# Patient Record
Sex: Male | Born: 1970 | Race: White | Hispanic: No | Marital: Single | Smoking: Current every day smoker
Health system: Southern US, Community
[De-identification: ages and names within clinical notes are randomized; demographics above are authoritative.]

---

## 2005-11-15 ENCOUNTER — Emergency Department: Payer: Self-pay | Admitting: Internal Medicine

## 2005-11-25 ENCOUNTER — Emergency Department: Payer: Self-pay | Admitting: Emergency Medicine

## 2007-07-15 IMAGING — CR RIGHT RING FINGER 2+V
1 series · 3 of 3 positions shown · non-contrast
Comparison: none

REASON FOR EXAM: injury MC2
COMMENTS:  LMP: (Male)

PROCEDURE:     DXR - DXR FINGER RING 4TH DIGIT RT ELAD  - November 15, 2005  [DATE]
RESULT:     Three views of the RIGHT fourth finger show no fracture,
dislocation or other acute bony abnormality. A bandage is present about the
PIP joint. No radiodense soft tissue foreign body is seen.

[Series 1: view not recorded · 0.17mm/px · 3 of 3 slices shown]
[im 1/3]
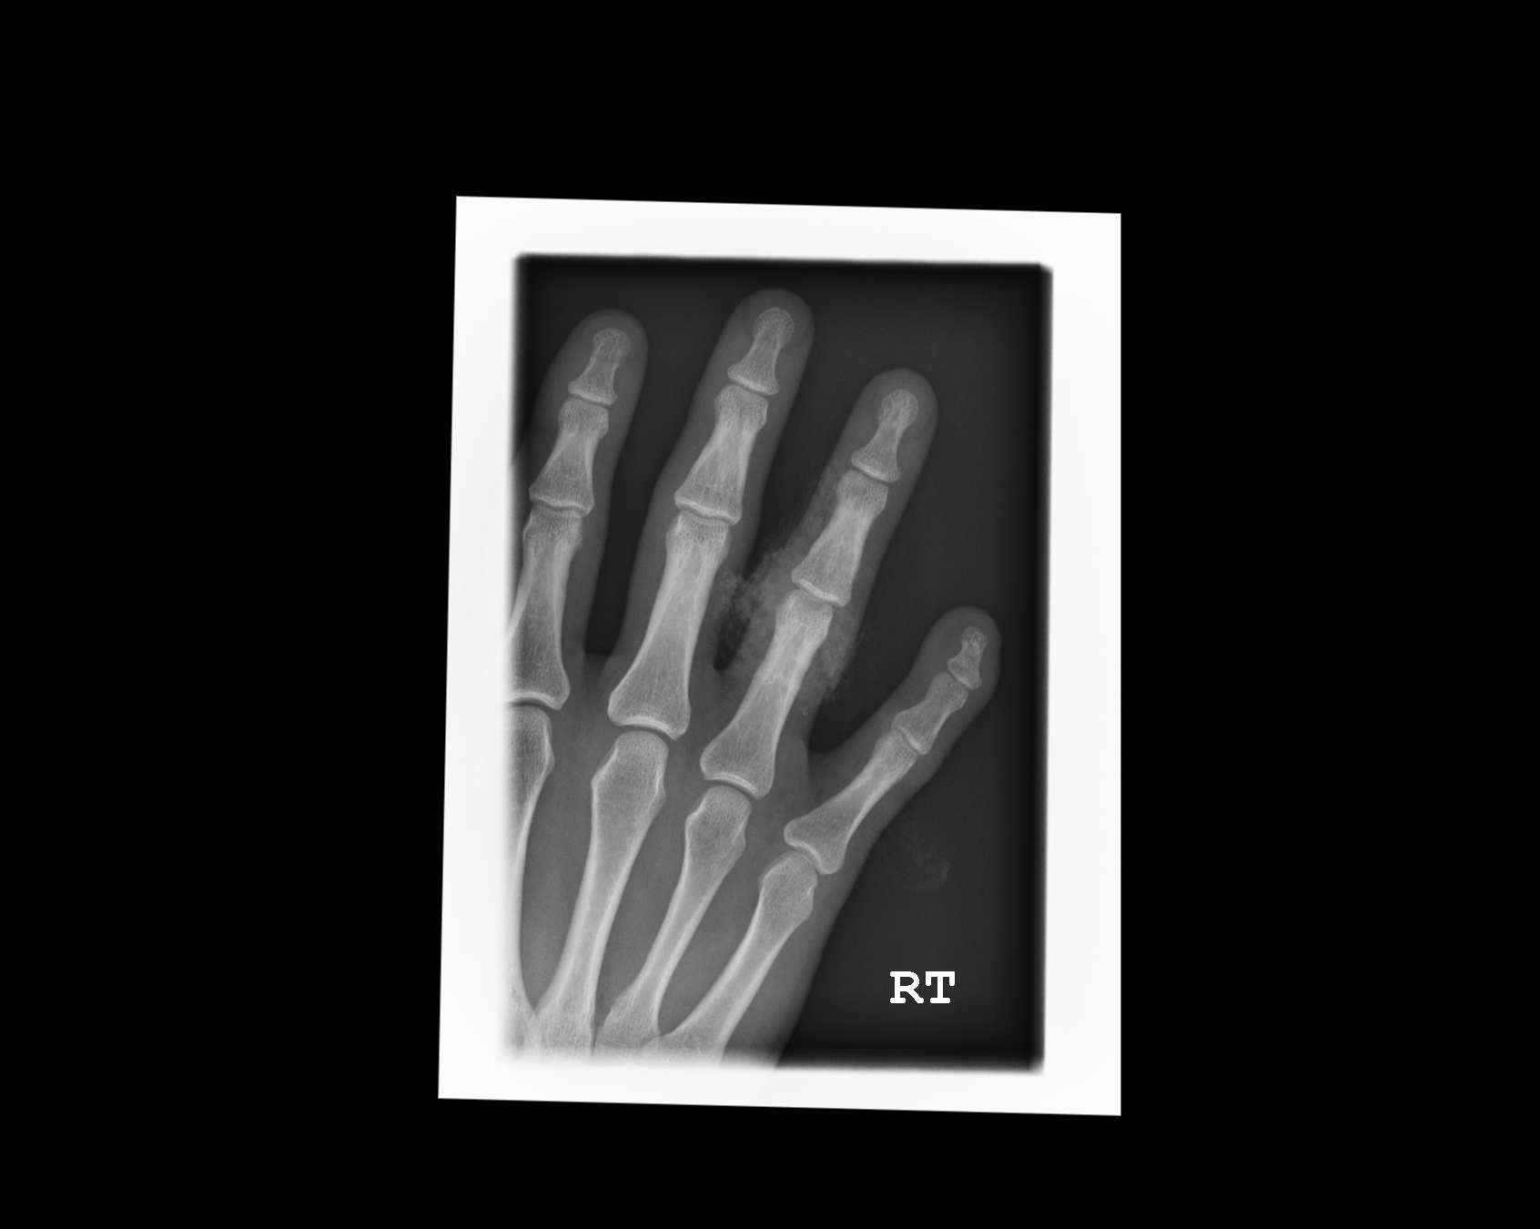
[im 2/3]
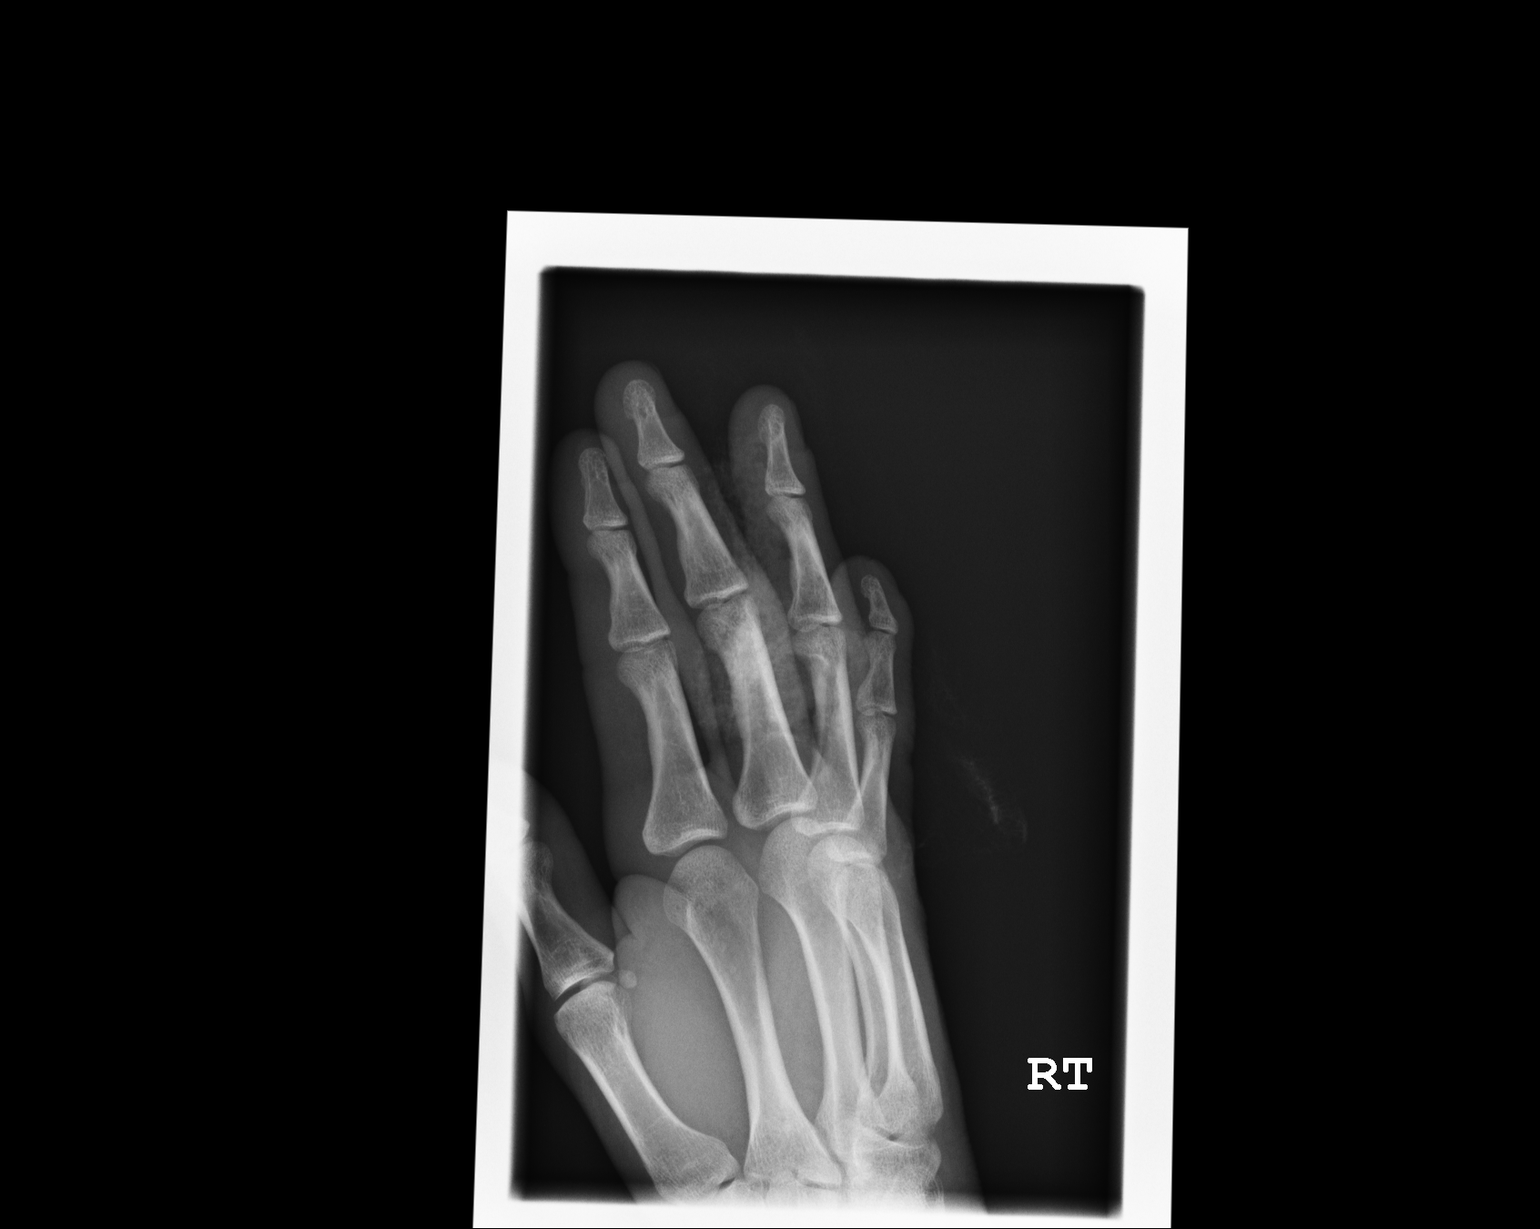
[im 3/3]
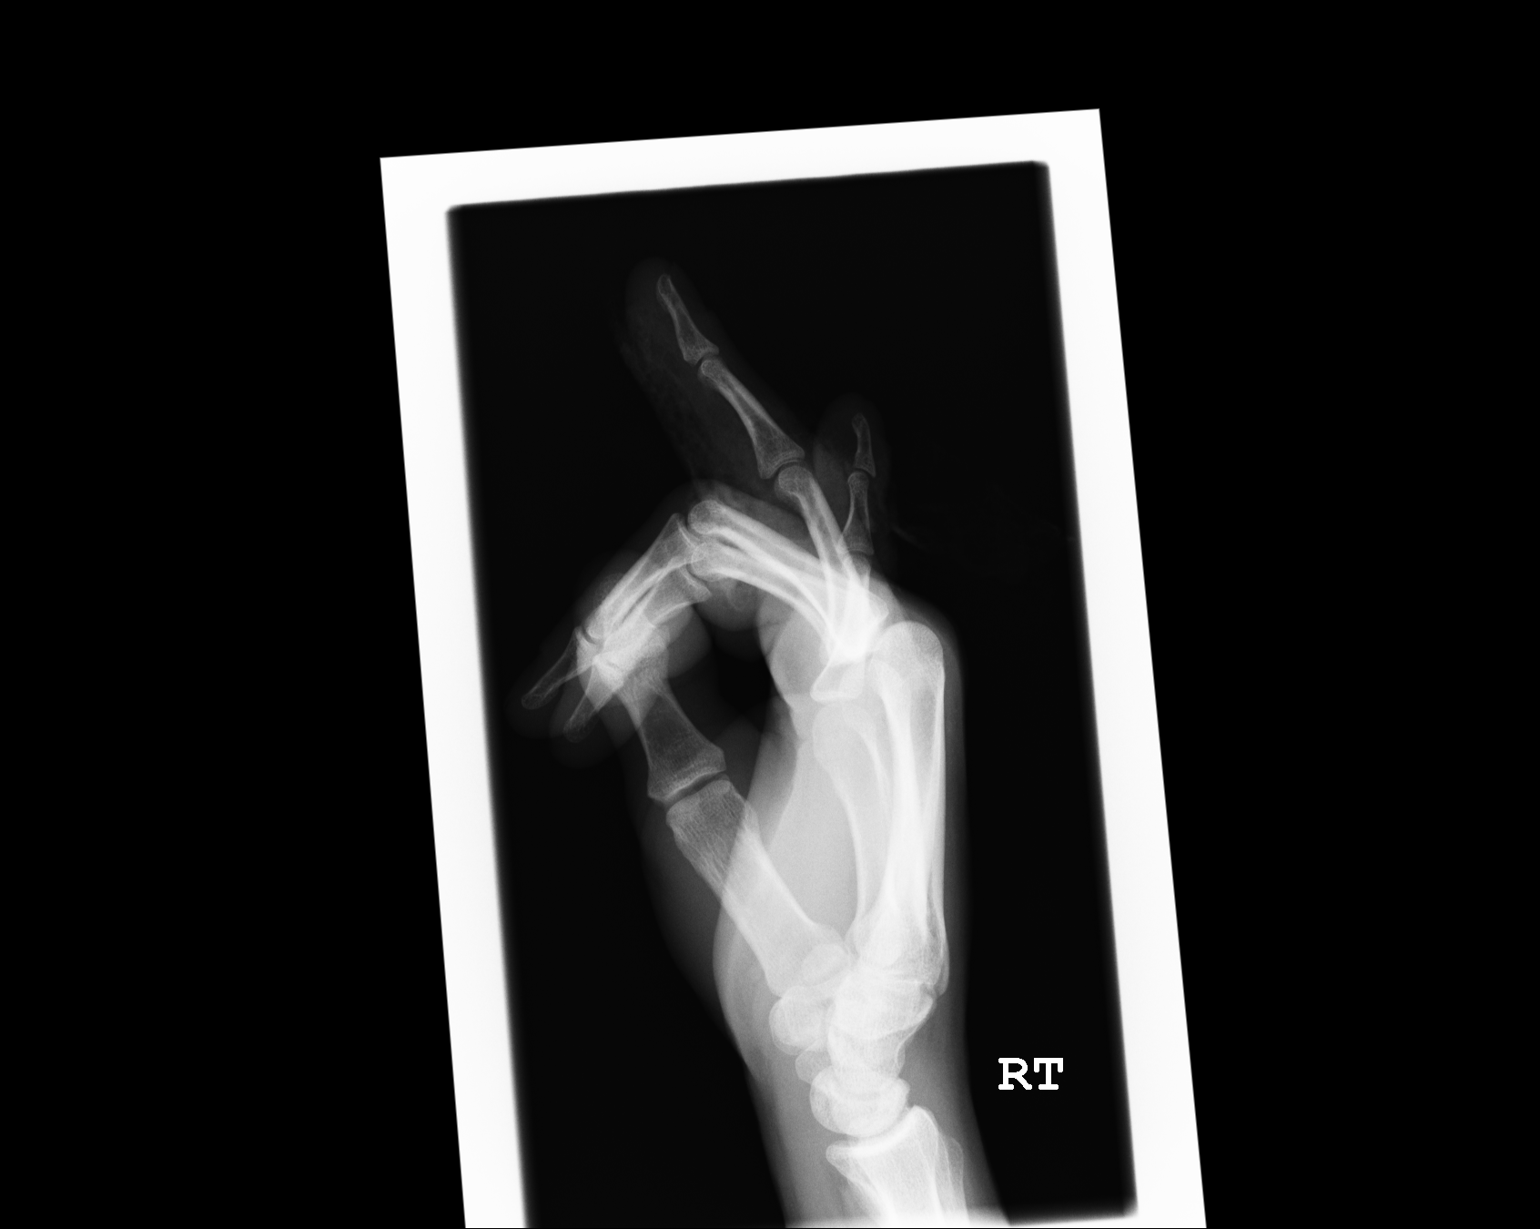

[3 of 3 positions shown; findings below may reference images not displayed]

IMPRESSION: 1)No acute bony abnormalities are identified.

## 2016-08-20 ENCOUNTER — Emergency Department
Admission: EM | Admit: 2016-08-20 | Discharge: 2016-08-20 | Disposition: A | Discharge status: Home / Self Care | Payer: Self-pay | Attending: Emergency Medicine | Admitting: Emergency Medicine

## 2016-08-20 ENCOUNTER — Encounter: Payer: Self-pay | Admitting: Emergency Medicine

## 2016-08-20 ENCOUNTER — Emergency Department: Payer: Self-pay

## 2016-08-20 DIAGNOSIS — Y999 Unspecified external cause status: Secondary | ICD-10-CM | POA: Insufficient documentation

## 2016-08-20 DIAGNOSIS — S52521A Torus fracture of lower end of right radius, initial encounter for closed fracture: Secondary | ICD-10-CM | POA: Insufficient documentation

## 2016-08-20 DIAGNOSIS — W19XXXA Unspecified fall, initial encounter: Secondary | ICD-10-CM | POA: Insufficient documentation

## 2016-08-20 DIAGNOSIS — Y9389 Activity, other specified: Secondary | ICD-10-CM | POA: Insufficient documentation

## 2016-08-20 DIAGNOSIS — Y929 Unspecified place or not applicable: Secondary | ICD-10-CM | POA: Insufficient documentation

## 2016-08-20 DIAGNOSIS — F17210 Nicotine dependence, cigarettes, uncomplicated: Secondary | ICD-10-CM | POA: Insufficient documentation

## 2016-08-20 NOTE — ED Triage Notes (Signed)
Fell yesterday while playing with grandchildren.  C/O right wrist pain.

## 2016-08-20 NOTE — ED Notes (Signed)
Full rom fingers, +2 pulse.

## 2016-08-20 NOTE — Discharge Instructions (Signed)
Wrist splint for 2-3 weeks as needed.

## 2016-08-20 NOTE — ED Provider Notes (Signed)
[facility] Emergency Department Provider Note   ____________________________________________   First MD Initiated Contact with Patient 08/20/16 1429     (approximate)  I have reviewed the triage vital signs and the nursing notes.   HISTORY  Chief Complaint Wrist Injury    HPI Juan Walters is a 46 y.o. male patient complaining of right wrist pain secondary to a fall while playing with gr children yesterday.   History reviewed. No pertinent past medical history.  There are no active problems to display for this patient.   History reviewed. No pertinent surgical history.  Prior to Admission medications   Not on File    Allergies Patient has no known allergies.  No family history on file.  Social History Social History  Substance Use Topics  . Smoking status: Current Every Day Smoker    Types: Cigarettes  . Smokeless tobacco: Never Used  . Alcohol use Not on file    Review of Systems Constitutional: No fever/chills Eyes: No visual changes. ENT: No sore throat. Cardiovascular: Denies chest pain. Respiratory: Denies shortness of breath. Gastrointestinal: No abdominal pain.  No nausea, no vomiting.  No diarrhea.  No constipation. Genitourinary: Negative for dysuria. Musculoskeletal: Right wrist pain Skin: Negative for rash. Neurological: Negative for headaches, focal weakness or numbness.    ____________________________________________   PHYSICAL EXAM:  VITAL SIGNS: ED Triage Vitals  Enc Vitals Group     BP 08/20/16 1354 (!) 158/80     Pulse Rate 08/20/16 1354 (!) 108     Resp 08/20/16 1354 16     Temp --      Temp src --      SpO2 08/20/16 1354 97 %     Weight 08/20/16 1350 130 lb (59 kg)     Height 08/20/16 1350  (1.651 m)     Head Circumference --      Peak Flow --      Pain Score 08/20/16 1350 7     Pain Loc --      Pain Edu? --      Excl. in GC? --     Constitutional: Alert and oriented. Well  appearing and in no acute distress. Eyes: Conjunctivae are normal. PERRL. EOMI. Head: Atraumatic. Nose: No congestion/rhinnorhea. Mouth/Throat: Mucous membranes are moist.  Oropharynx non-erythematous. Neck: No stridor.  No cervical spine tenderness to palpation. Hematological/Lymphatic/Immunilogical: No cervical lymphadenopathy. Cardiovascular: Normal rate, regular rhythm. Grossly normal heart sounds.  Good peripheral circulation. Respiratory: Normal respiratory effort.  No retractions. Lungs CTAB. Gastrointestinal: Soft and nontender. No distention. No abdominal bruits. No CVA tenderness. Musculoskeletal: No lower extremity tenderness nor edema.  No joint effusions. Neurologic:  Normal speech and language. No gross focal neurologic deficits are appreciated. No gait instability. Skin:  Skin is warm, dry and intact. No rash noted. Psychiatric: Mood and affect are normal. Speech and behavior are normal.  ____________________________________________   LABS (all labs ordered are listed, but only abnormal results are displayed)  Labs Reviewed - No data to display ____________________________________________  EKG   ____________________________________________  RADIOLOGY  Buckle fracture distal right radius. ____________________________________________   PROCEDURES  Procedure(s) performed: None  Procedures  Critical Care performed: No  ____________________________________________   INITIAL IMPRESSION / ASSESSMENT AND PLAN / ED COURSE  Pertinent labs & imaging results that were available during my care of the patient were reviewed by me and considered in my medical decision making (see chart for details).  Right wrist pain secondary to a fall.  X-ray pending.       ____________________________________________   FINAL CLINICAL IMPRESSION(S) / ED DIAGNOSES  Final diagnoses:  Closed torus fracture of distal end of right radius, initial encounter   Discussed x-ray  finding with patient. Patient placed in a hand splint. Patient advised follow orthopedics as noted no improvement worsening complaint 2 weeks. Advised over-the-counter anti-inflammatory medication.   NEW MEDICATIONS STARTED DURING THIS VISIT:  New Prescriptions   No medications on file     Note:  This document was prepared using Dragon voice recognition software and may include unintentional dictation errors.    Joni Reining, PA-C 08/20/16 1443    Jene Every, MD 08/20/16 [phone]

## 2018-04-19 IMAGING — CR DG WRIST COMPLETE 3+V*R*
1 series · 4 of 4 positions shown · non-contrast
Comparison: None.

CLINICAL DATA: Fell on wrist yesterday, felt a pop.

EXAM:
RIGHT WRIST - COMPLETE 3+ VIEW

[Series 1: x wrist pa right · 0.14mm/px · 4 of 4 slices shown]
[im 1/4]
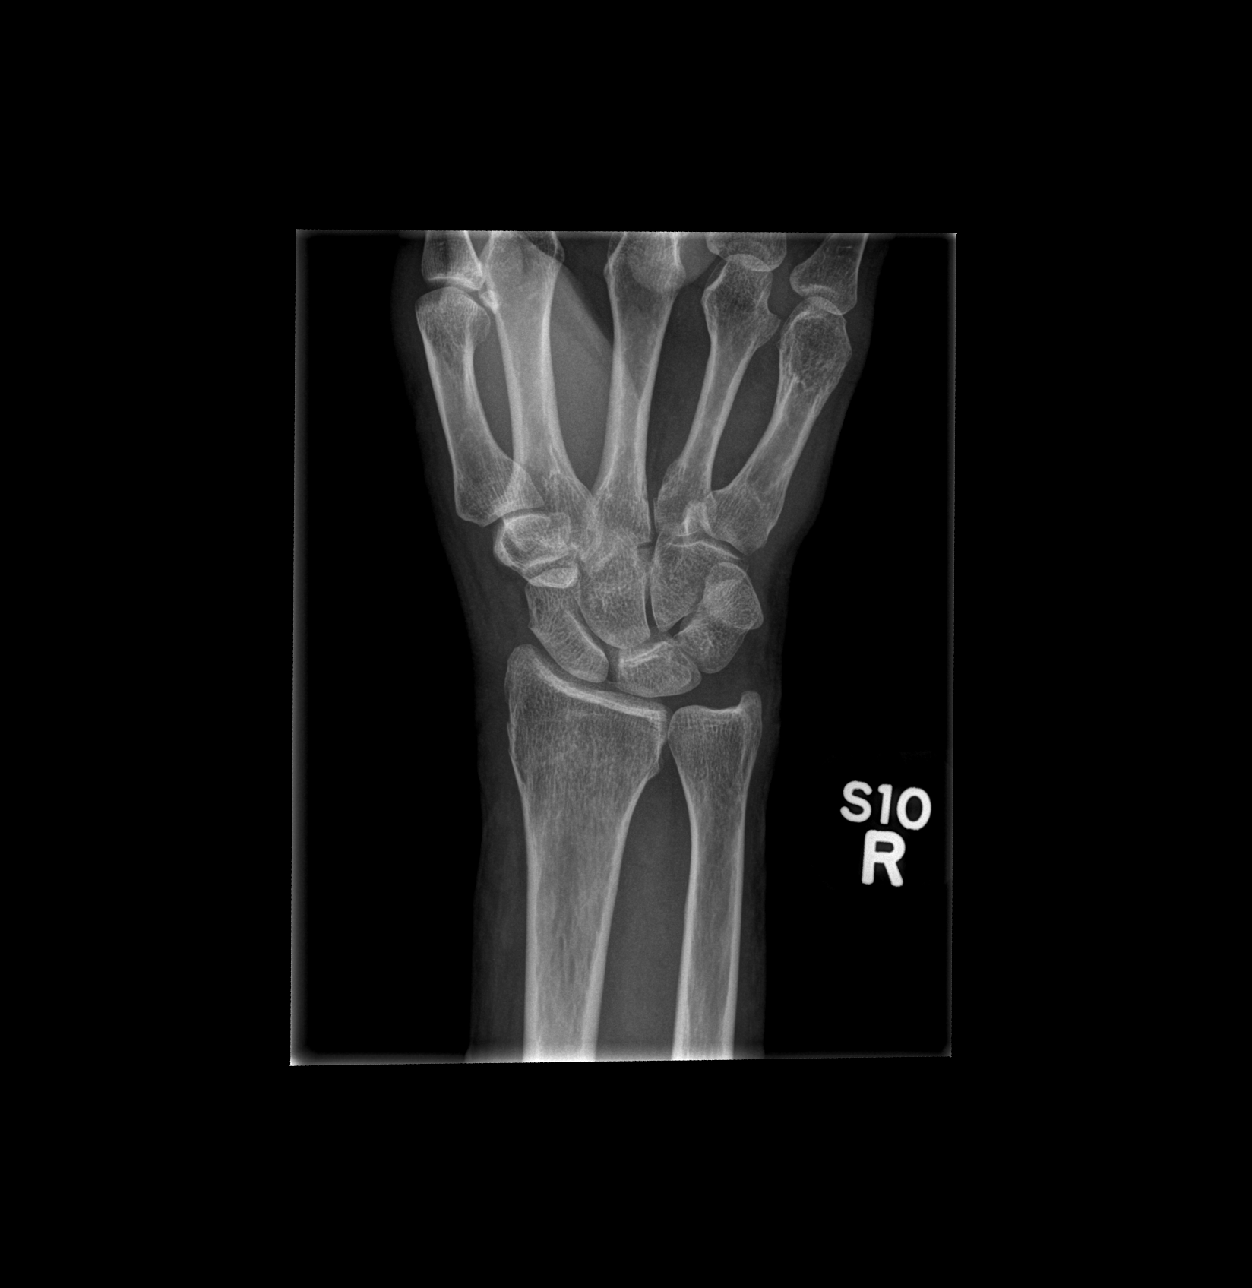
[im 2/4]
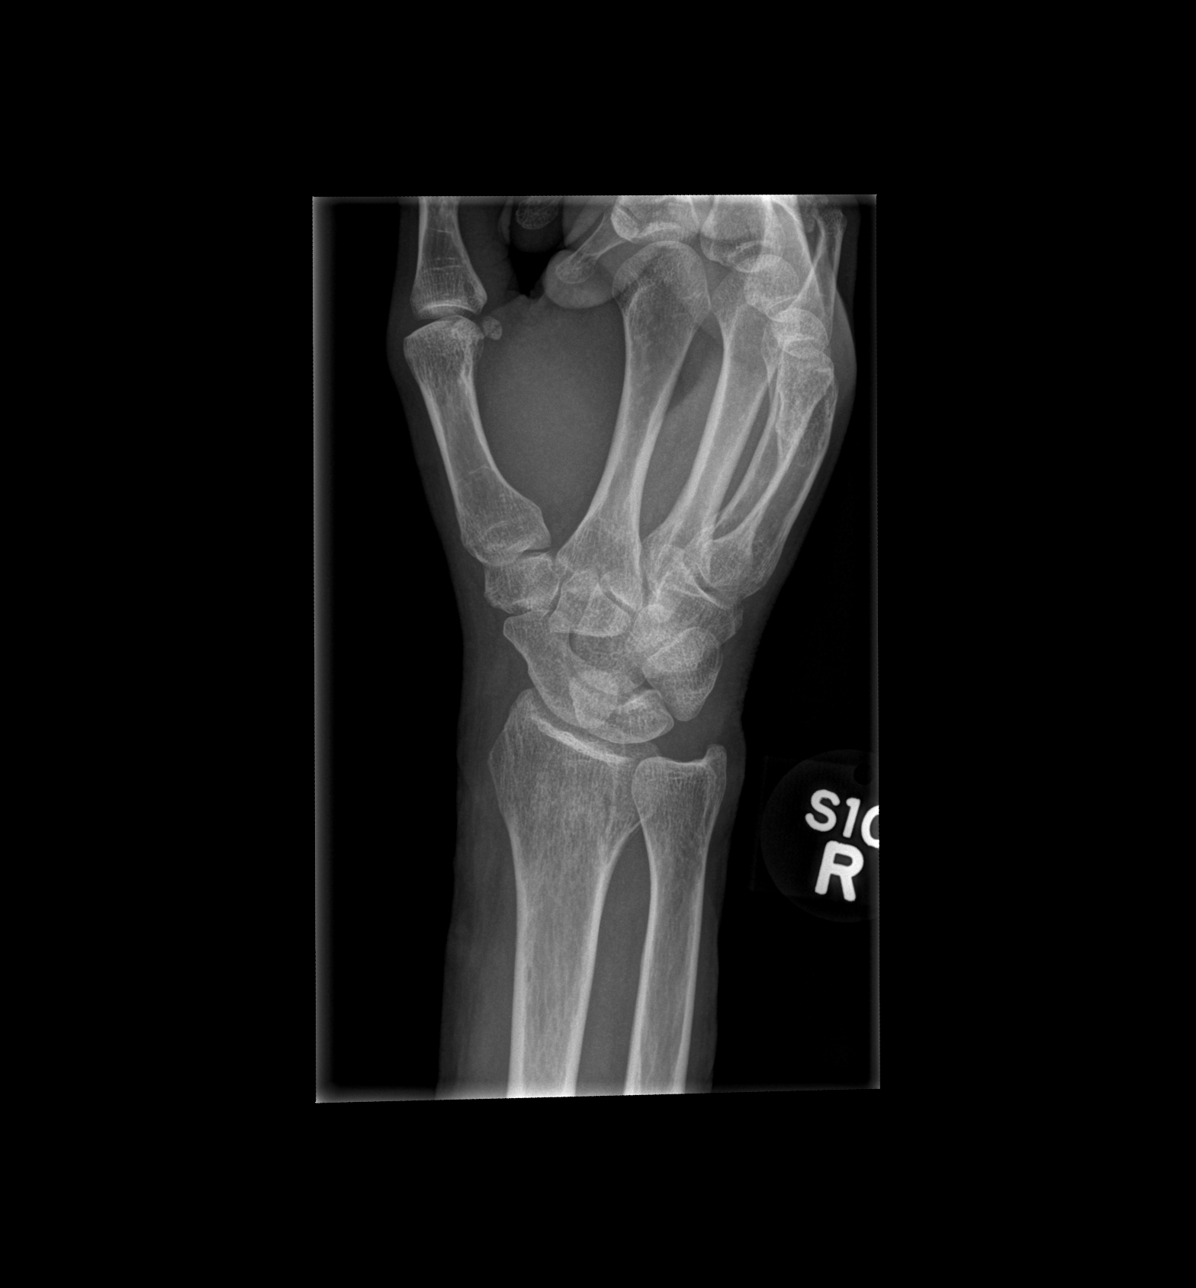
[im 3/4]
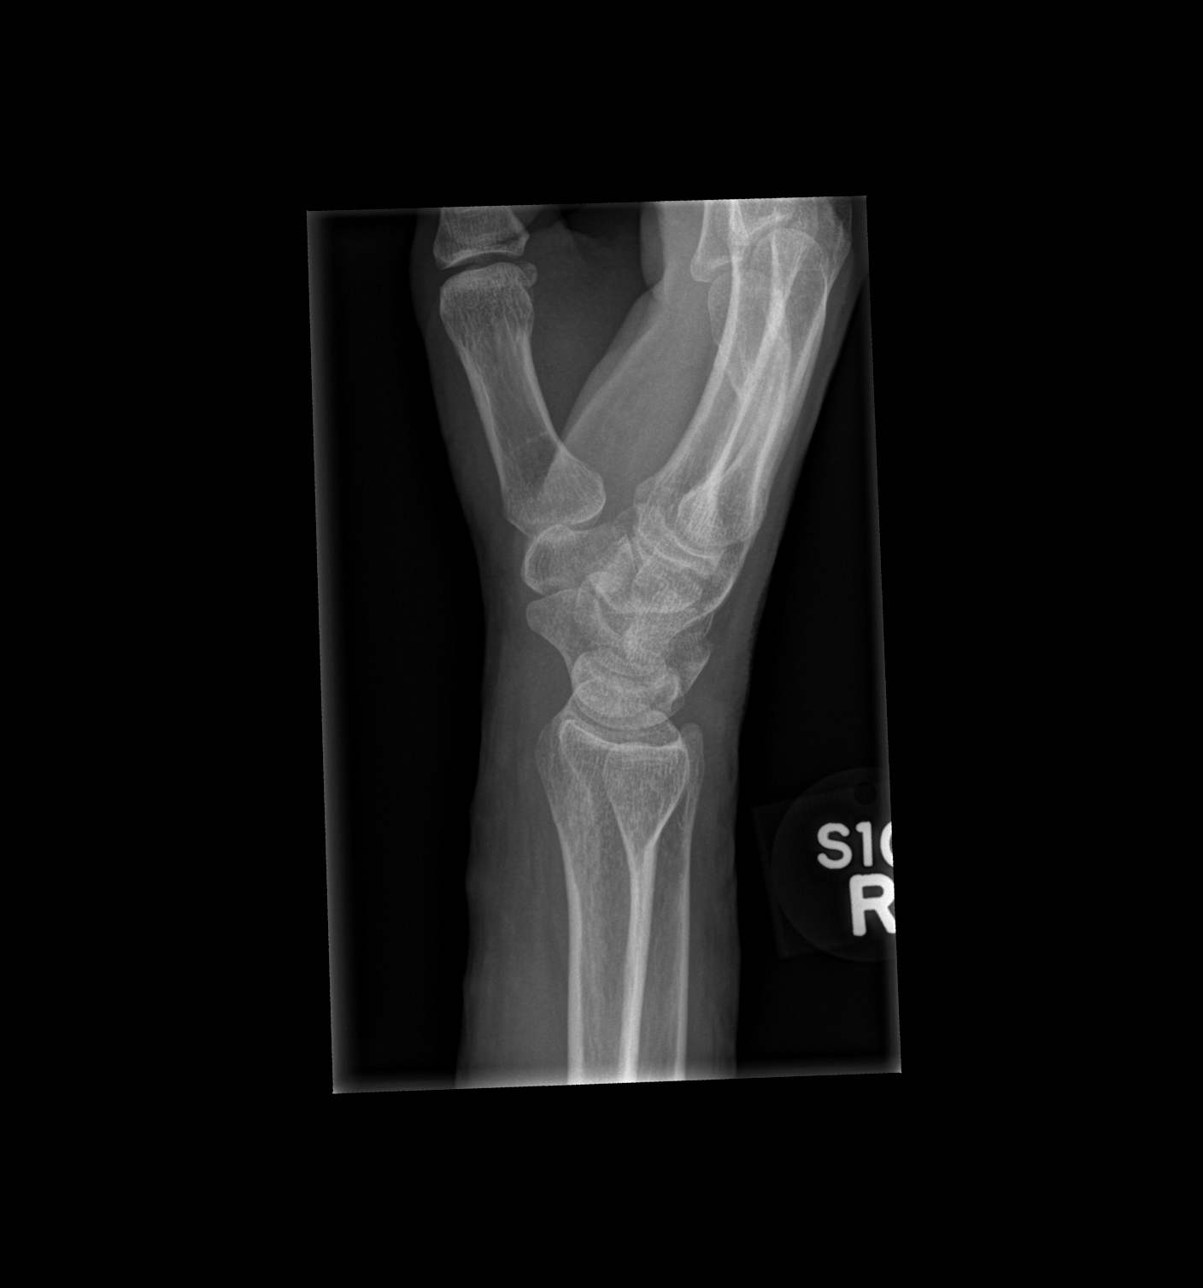
[im 4/4]
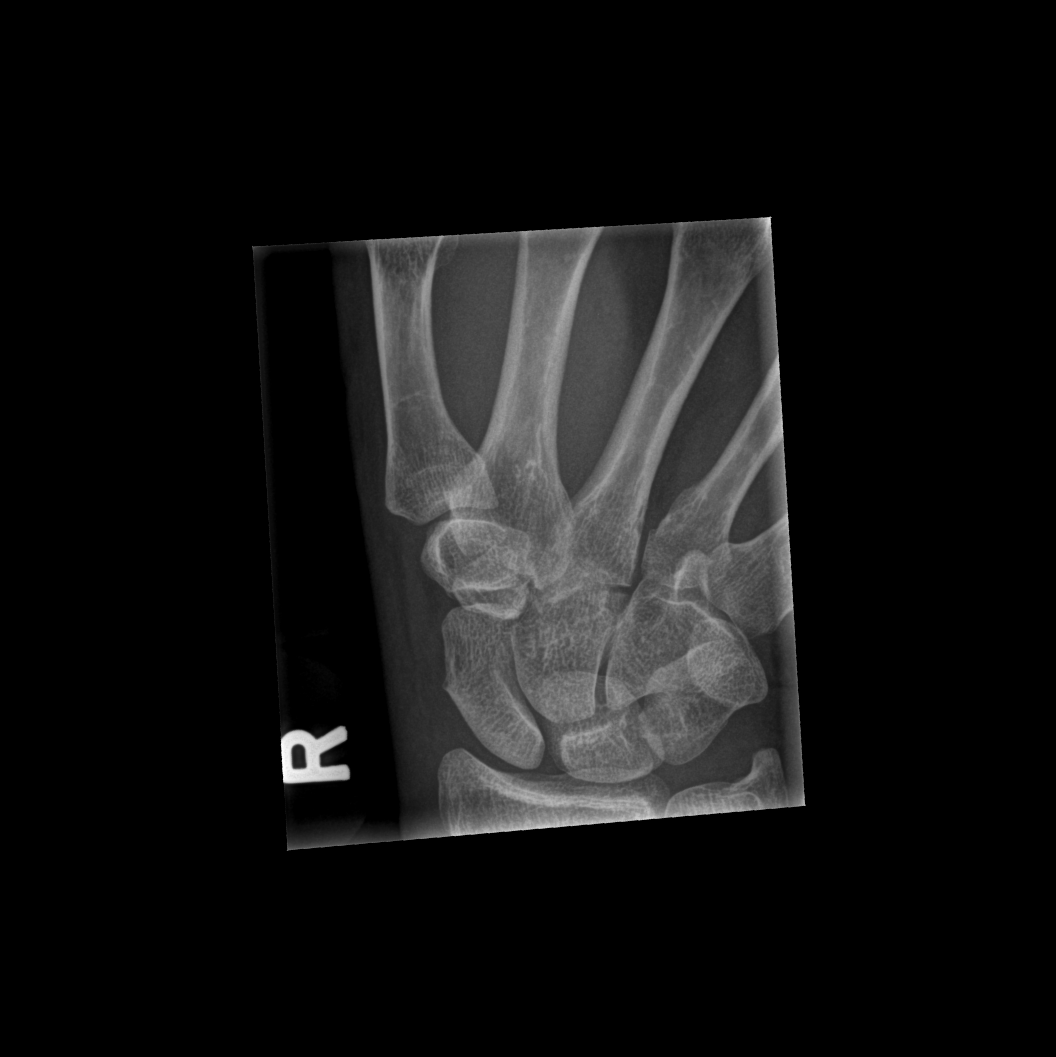

[4 of 4 positions shown; findings below may reference images not displayed]

FINDINGS: Slight buckling of the distal radial metaphyseal cortex with faint
linear lucency. No dislocation. No destructive bony lesions. Soft
tissue planes are nonsuspicious.
IMPRESSION: Suspected nondisplaced distal radial fracture without dislocation.
Recommend follow-up radiograph in 1 week to verify findings.

## 2021-08-16 DEATH — deceased
